# Patient Record
Sex: Female | Born: 1945 | Hispanic: No | Marital: Married | Smoking: Never smoker
Health system: Southern US, Community
[De-identification: ages and names within clinical notes are randomized; demographics above are authoritative.]

## PROBLEM LIST (undated history)

## (undated) DIAGNOSIS — I10 Essential (primary) hypertension: Secondary | ICD-10-CM

---

## 1999-11-15 ENCOUNTER — Other Ambulatory Visit: Admission: RE | Admit: 1999-11-15 | Discharge: 1999-11-15 | Payer: Self-pay | Admitting: Obstetrics and Gynecology

## 2010-09-29 ENCOUNTER — Ambulatory Visit: Payer: Self-pay | Admitting: Unknown Physician Specialty

## 2010-09-30 LAB — PATHOLOGY REPORT

## 2010-10-18 ENCOUNTER — Ambulatory Visit: Payer: Self-pay | Admitting: Surgery

## 2010-10-21 ENCOUNTER — Ambulatory Visit: Payer: Self-pay | Admitting: Surgery

## 2012-01-25 ENCOUNTER — Ambulatory Visit: Payer: Self-pay | Admitting: Unknown Physician Specialty

## 2012-01-30 LAB — PATHOLOGY REPORT

## 2012-02-13 ENCOUNTER — Ambulatory Visit: Payer: Self-pay | Admitting: Unknown Physician Specialty

## 2012-02-13 LAB — CBC WITH DIFFERENTIAL/PLATELET
Basophil #: 0 10*3/uL (ref 0.0–0.1)
Eosinophil #: 0.3 10*3/uL (ref 0.0–0.7)
Eosinophil %: 4.3 %
HGB: 11.8 g/dL — ABNORMAL LOW (ref 12.0–16.0)
Lymphocyte #: 2.3 10*3/uL (ref 1.0–3.6)
Lymphocyte %: 38.6 %
MCHC: 33 g/dL (ref 32.0–36.0)
MCV: 90 fL (ref 80–100)
Monocyte #: 0.3 10*3/uL (ref 0.0–0.7)
Neutrophil %: 51.1 %
Platelet: 286 10*3/uL (ref 150–440)

## 2012-02-13 LAB — BASIC METABOLIC PANEL
Anion Gap: 8 (ref 7–16)
BUN: 12 mg/dL (ref 7–18)
Chloride: 108 mmol/L — ABNORMAL HIGH (ref 98–107)
Co2: 26 mmol/L (ref 21–32)
EGFR (African American): >60
EGFR (Non-African Amer.): >60
Glucose: 126 mg/dL — ABNORMAL HIGH (ref 65–99)
Osmolality: 284 (ref 275–301)
Potassium: 4 mmol/L (ref 3.5–5.1)
Sodium: 142 mmol/L (ref 136–145)

## 2012-02-16 ENCOUNTER — Ambulatory Visit: Payer: Self-pay | Admitting: Surgery

## 2012-08-21 ENCOUNTER — Ambulatory Visit: Payer: Self-pay | Admitting: Internal Medicine

## 2012-12-28 ENCOUNTER — Emergency Department: Payer: Self-pay | Admitting: Emergency Medicine

## 2012-12-28 LAB — CBC
HCT: 42 % (ref 35.0–47.0)
HGB: 13.6 g/dL (ref 12.0–16.0)
MCV: 89 fL (ref 80–100)
Platelet: 303 10*3/uL (ref 150–440)
RDW: 13.3 % (ref 11.5–14.5)
WBC: 7.8 10*3/uL (ref 3.6–11.0)

## 2012-12-28 LAB — COMPREHENSIVE METABOLIC PANEL
Albumin: 4.1 g/dL (ref 3.4–5.0)
Alkaline Phosphatase: 157 U/L — ABNORMAL HIGH (ref 50–136)
BUN: 11 mg/dL (ref 7–18)
Bilirubin,Total: 0.5 mg/dL (ref 0.2–1.0)
Calcium, Total: 8.9 mg/dL (ref 8.5–10.1)
Co2: 19 mmol/L — ABNORMAL LOW (ref 21–32)
Creatinine: 0.6 mg/dL (ref 0.60–1.30)
SGOT(AST): 40 U/L — ABNORMAL HIGH (ref 15–37)
SGPT (ALT): 44 U/L (ref 12–78)
Sodium: 137 mmol/L (ref 136–145)
Total Protein: 8.5 g/dL — ABNORMAL HIGH (ref 6.4–8.2)

## 2012-12-28 LAB — LIPASE, BLOOD: Lipase: 112 U/L (ref 73–393)

## 2012-12-31 LAB — STOOL CULTURE

## 2015-03-07 NOTE — Op Note (Signed)
PATIENT NAME:  Ana Romero, Ana Romero MR#:  [id number] DATE OF BIRTH:  [birth year]  DATE OF PROCEDURE:  02/16/2012  PREOPERATIVE DIAGNOSIS: Chronic anal fissure.   POSTOPERATIVE DIAGNOSIS: Chronic anal fissure.   PROCEDURE: Lateral internal anal sphincterotomy.   SURGEON: Adella HareJ. Wilton Smith, MD  ANESTHESIA: General.   INDICATIONS: This 69 year old female has history of anal pain and anal fissure. She has been using stool softeners, however, with time her fissure has not healed, still having anal pain and surgery was recommended for definitive treatment.   DESCRIPTION OF PROCEDURE: The patient was placed on the operating table in the supine position under general anesthesia. Legs were elevated into the lithotomy position using ankle straps. The anal area was prepared with Betadine solution, draped with sterile towels and sheets.   Initial inspection revealed a minimal amount of external hemorrhoids. There was a posterior anal fissure. Digital exam demonstrated no palpable rectal mass. The anoderm on the patient'Romero left side was infiltrated with 0.5% Sensorcaine with epinephrine, and also infiltrated around the fissure as well. Next the anal canal was dilated large enough to admit three fingers. The bivalve anal retractor was introduced. There was some moderate enlargement of internal hemorrhoids. No blood was seen. No neoplasm seen. Some soft stool was delivered out of the rectum. Next, the incision was made on the patient'Romero left side at the 3:00 position. Incision was some 12 mm in length, carried down into subcutaneous tissues. Several small bleeding points were cauterized. Two hemostats were used to dissect the internal anal sphincter which was recognized by its white color. It did appear to be somewhat hypertrophied and 90% of the sphincter was divided with electrocautery. Hemostasis was intact. The wound was loosely approximated with interrupted 5-0 chromic simple sutures just placing two sutures leaving an  opening distally for drainage. Subsequently the anal retractor was removed. Dressings were applied with paper tape.    The patient tolerated surgery satisfactorily and was then prepared for transfer to the recovery room.  ____________________________ Shela CommonsJ. Renda RollsWilton Smith, MD jws:cms D: 02/16/2012 09:35:18 ET T: 02/16/2012 10:18:35 ET JOB#: [id number]  cc: Adella HareJ. Wilton Smith, MD, <Dictator> Adella HareWILTON J SMITH MD ELECTRONICALLY SIGNED 02/16/2012 12:03

## 2017-01-11 ENCOUNTER — Other Ambulatory Visit: Payer: Self-pay | Admitting: General Surgery

## 2017-01-11 DIAGNOSIS — K6289 Other specified diseases of anus and rectum: Secondary | ICD-10-CM

## 2017-01-23 ENCOUNTER — Other Ambulatory Visit: Payer: Self-pay

## 2017-02-01 ENCOUNTER — Ambulatory Visit
Admission: RE | Admit: 2017-02-01 | Discharge: 2017-02-01 | Disposition: A | Discharge status: Home / Self Care | Payer: PRIVATE HEALTH INSURANCE | Source: Ambulatory Visit | Attending: General Surgery | Admitting: General Surgery

## 2017-02-01 DIAGNOSIS — K6289 Other specified diseases of anus and rectum: Secondary | ICD-10-CM

## 2017-02-01 MED ORDER — GADOBENATE DIMEGLUMINE 529 MG/ML IV SOLN
14.0000 mL | Freq: Once | INTRAVENOUS | Status: AC | PRN
  Administered 2017-02-01: 14 mL via INTRAVENOUS

## 2018-04-05 IMAGING — MR MR PELVIS WO/W CM
4 of 8 series · 24 of 48 positions shown · IV contrast (multihance)
Comparison: 12/29/2012 CT abdomen/ pelvis.

CLINICAL DATA: Chronic anorectal pain with worsening pain and
intermittent rectal bleeding for 1 year. History of lateral internal
anal sphincterotomy for chronic anal fissure on 02/16/2012.

EXAM:
MRI PELVIS WITHOUT AND WITH CONTRAST
TECHNIQUE: Multiplanar multisequence MR imaging of the pelvis was performed
both before and after administration of intravenous contrast.
Creatinine was obtained on site at [HOSPITAL] at [HOSPITAL].
Results: Creatinine 0.7 mg/dL.
CONTRAST:  14mL MULTIHANCE GADOBENATE DIMEGLUMINE 529 MG/ML IV SOLN

[Series 2: T2 · sagittal · 2.5mm · 0.81mm/px · 7 of 36 slices shown (1 of 2)]
[im 1/36]
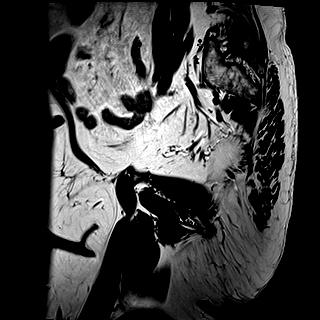
[im 6/36]
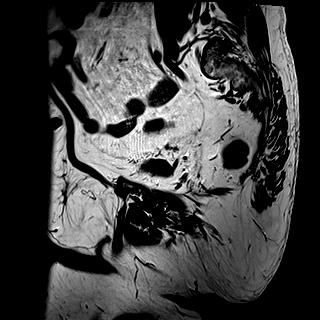
[im 12/36]
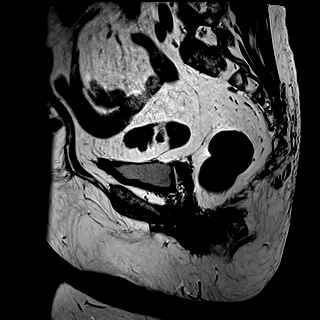
[im 18/36]
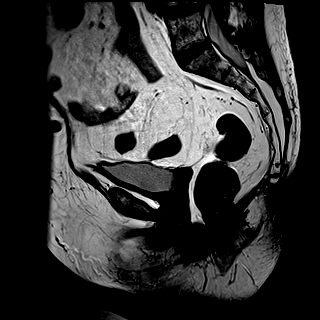
[im 24/36]
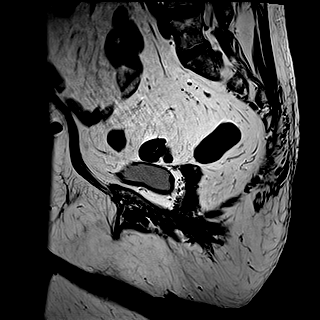
[im 30/36]
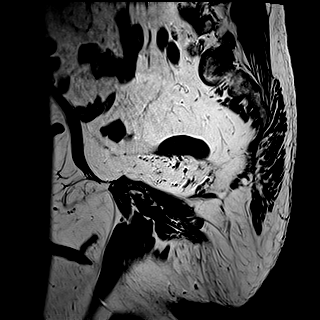
[im 36/36]
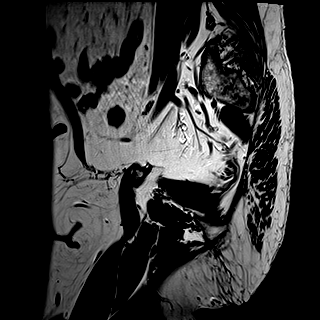

[Series 3: sag 2 fs · sagittal · 2.5mm · 0.41mm/px · 7 of 36 slices shown]
[im 1/36]
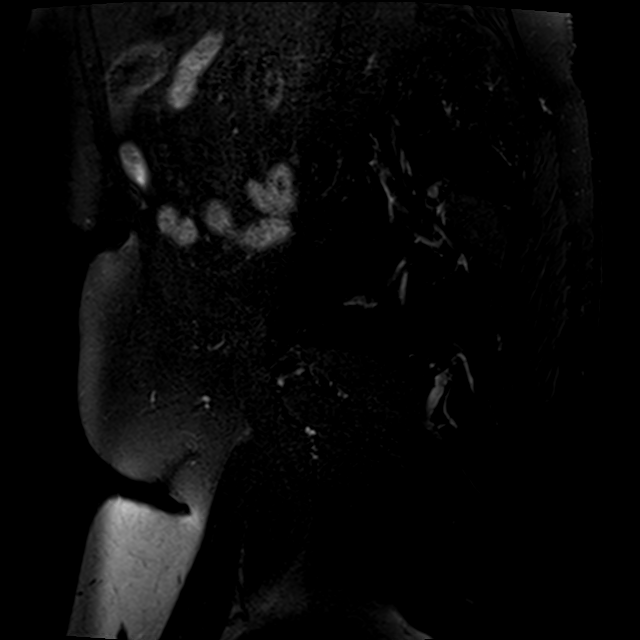
[im 6/36]
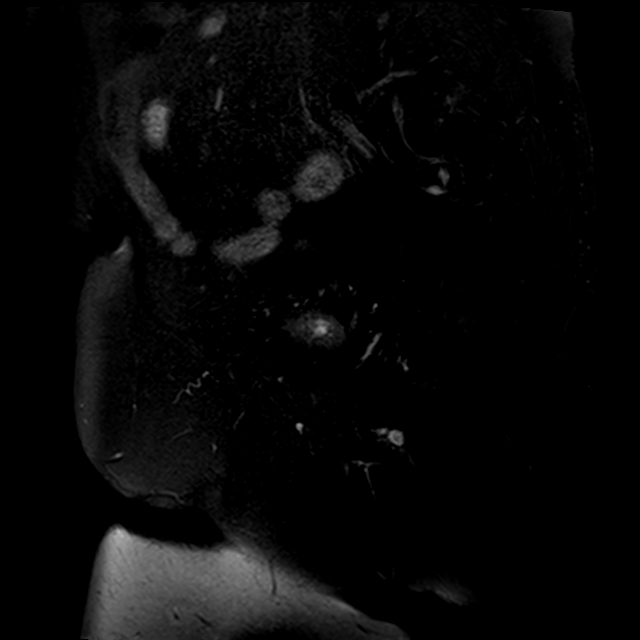
[im 12/36]
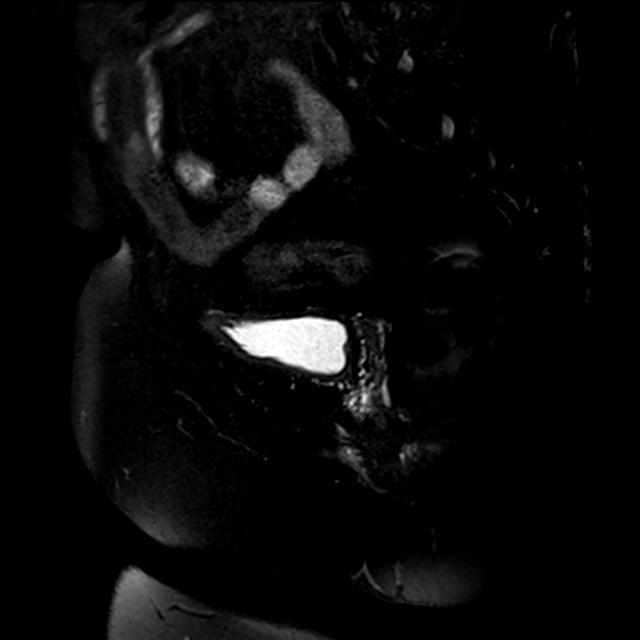
[im 18/36]
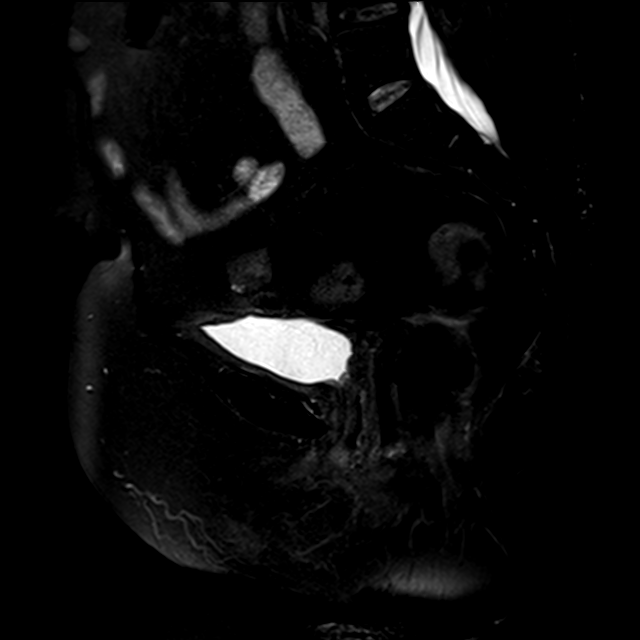
[im 24/36]
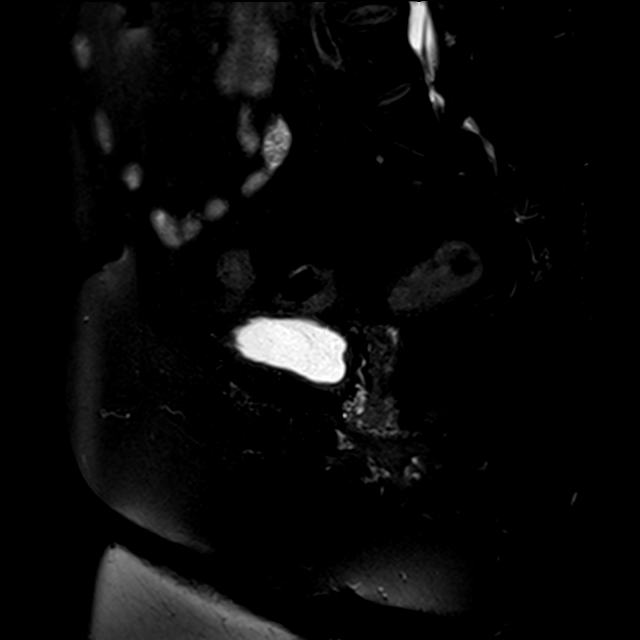
[im 30/36]
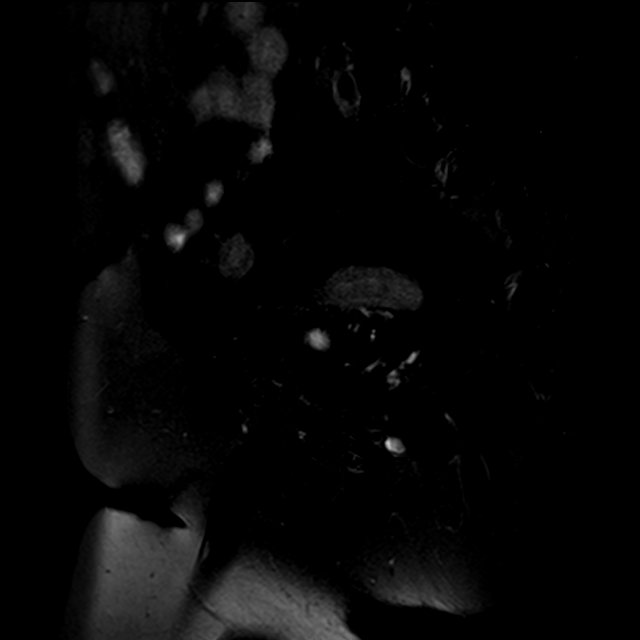
[im 36/36]
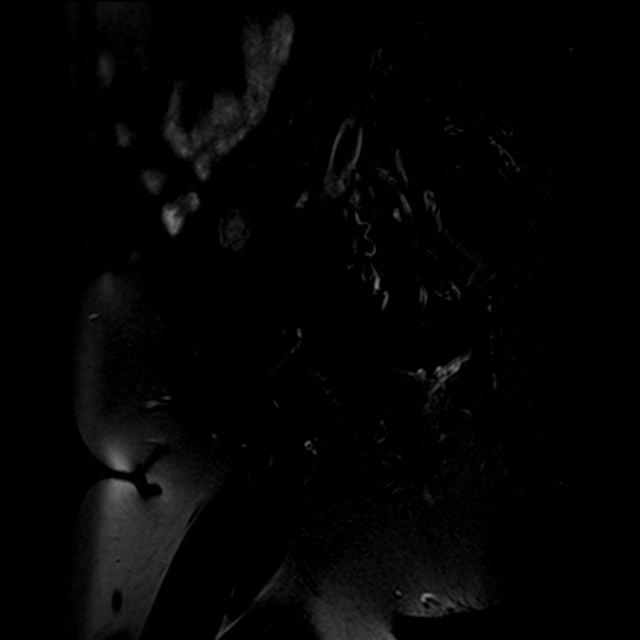

[Series 4: T1 · axial · 4.0mm · 0.57mm/px · z∈[-195,-46]mm · 6 of 35 slices shown]
[im 1/35]
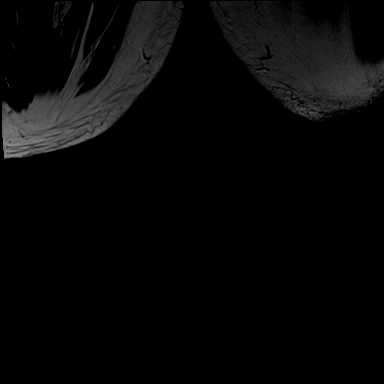
[im 7/35]
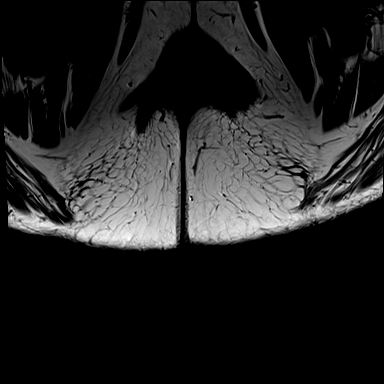
[im 14/35]
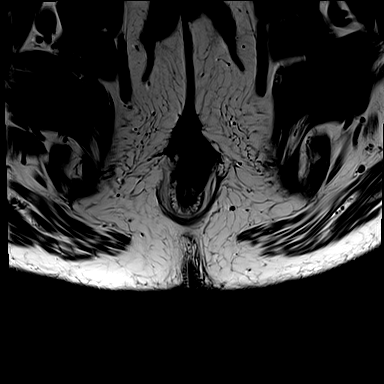
[im 21/35]
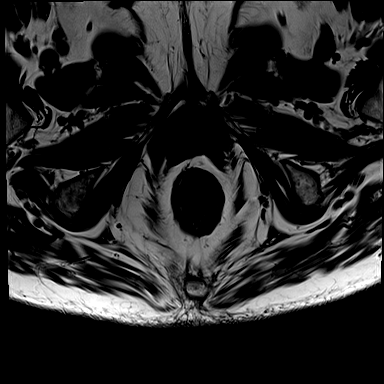
[im 28/35]
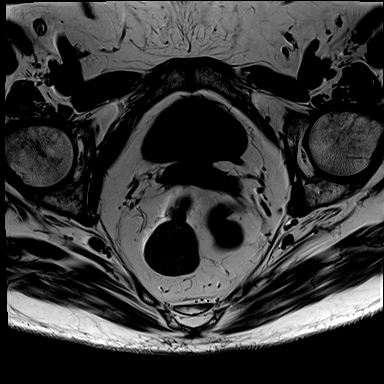
[im 35/35]
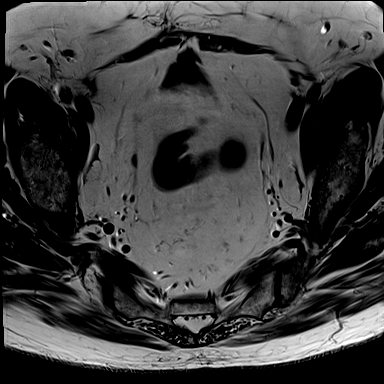

[Series 5: T2 · axial · 4.0mm · 0.57mm/px · z∈[-195,-46]mm · 4 of 35 slices shown (2 of 2)]
[im 1/35]
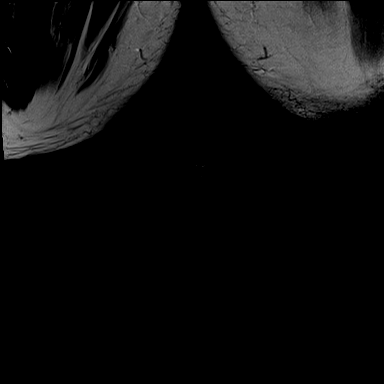
[im 7/35]
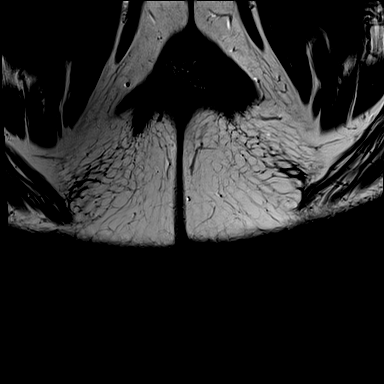
[im 21/35]
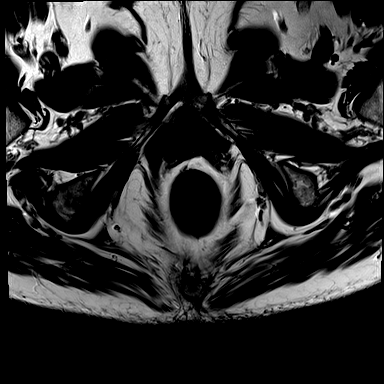
[im 35/35]
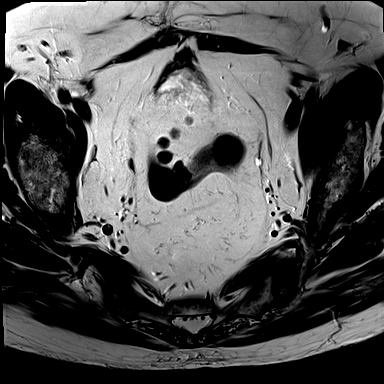

[24 of 48 positions shown; findings below may reference images not displayed]

FINDINGS: Urinary Tract:  Normal bladder.  Normal urethra.

Bowel: Mild sigmoid diverticulosis. There is annular low T2 signal
at the internal anal sphincter, correlating with high-density in
this location on the 12/29/2012 CT study, compatible with
postsurgical change from prior internal anal sphincterotomy. There
is mild circumferential wall thickening and enhancement of the anal
wall (series 9/images 18-19), suggestive of external hemorrhoids. No
evidence of a perianal fistula. No perirectal or perianal fluid
collections.

Vascular/Lymphatic: No pathologically enlarged lymph nodes in the
pelvis.

Reproductive:

Uterus: Status post hysterectomy. No mass or fluid collection at the
vaginal cuff.

Ovaries and Adnexa: The ovaries are not visualized on either side on
this study, which does not contain comprehensive images of the upper
pelvis given anal fistula protocol. No adnexal masses.

Other: No abnormal free fluid in the pelvis. No focal pelvic fluid
collection.

Musculoskeletal: No aggressive appearing focal osseous lesions.
IMPRESSION: 1. Expected postsurgical changes from prior internal anal
sphincterotomy. No evidence of a perianal fistula. No perirectal or
perianal fluid collections.
2. Nonspecific mild circumferential wall thickening and enhancement
of the anal wall, favor external hemorrhoids.
3. Mild sigmoid diverticulosis.
4. Status post hysterectomy, with no abnormal findings at the
vaginal cuff.

## 2021-04-20 ENCOUNTER — Other Ambulatory Visit: Payer: Self-pay

## 2021-04-20 ENCOUNTER — Emergency Department
Admission: EM | Admit: 2021-04-20 | Discharge: 2021-04-20 | Disposition: A | Discharge status: Home / Self Care | Payer: Medicare Other | Attending: Emergency Medicine | Admitting: Emergency Medicine

## 2021-04-20 ENCOUNTER — Encounter: Payer: Self-pay | Admitting: Emergency Medicine

## 2021-04-20 DIAGNOSIS — R21 Rash and other nonspecific skin eruption: Secondary | ICD-10-CM | POA: Diagnosis not present

## 2021-04-20 DIAGNOSIS — R609 Edema, unspecified: Secondary | ICD-10-CM

## 2021-04-20 DIAGNOSIS — Z20822 Contact with and (suspected) exposure to covid-19: Secondary | ICD-10-CM | POA: Insufficient documentation

## 2021-04-20 DIAGNOSIS — I10 Essential (primary) hypertension: Secondary | ICD-10-CM | POA: Insufficient documentation

## 2021-04-20 DIAGNOSIS — R600 Localized edema: Secondary | ICD-10-CM | POA: Insufficient documentation

## 2021-04-20 HISTORY — DX: Essential (primary) hypertension: I10

## 2021-04-20 LAB — COMPREHENSIVE METABOLIC PANEL
ALT: 26 U/L (ref 0–44)
AST: 27 U/L (ref 15–41)
Albumin: 3.5 g/dL (ref 3.5–5.0)
Alkaline Phosphatase: 109 U/L (ref 38–126)
Anion gap: 12 (ref 5–15)
BUN: 10 mg/dL (ref 8–23)
CO2: 22 mmol/L (ref 22–32)
Calcium: 8.8 mg/dL — ABNORMAL LOW (ref 8.9–10.3)
Chloride: 104 mmol/L (ref 98–111)
Creatinine, Ser: 0.62 mg/dL (ref 0.44–1.00)
GFR, Estimated: >60 mL/min (ref >60)
Glucose, Bld: 128 mg/dL — ABNORMAL HIGH (ref 70–99)
Potassium: 3.2 mmol/L — ABNORMAL LOW (ref 3.5–5.1)
Sodium: 138 mmol/L (ref 135–145)
Total Bilirubin: 0.6 mg/dL (ref 0.3–1.2)
Total Protein: 6.5 g/dL (ref 6.5–8.1)

## 2021-04-20 LAB — CBC
HCT: 30.9 % — ABNORMAL LOW (ref 36.0–46.0)
Hemoglobin: 10.5 g/dL — ABNORMAL LOW (ref 12.0–15.0)
MCH: 30.2 pg (ref 26.0–34.0)
MCHC: 34 g/dL (ref 30.0–36.0)
MCV: 88.8 fL (ref 80.0–100.0)
Platelets: 289 10*3/uL (ref 150–400)
RBC: 3.48 MIL/uL — ABNORMAL LOW (ref 3.87–5.11)
RDW: 12.5 % (ref 11.5–15.5)
WBC: 5.7 10*3/uL (ref 4.0–10.5)
nRBC: 0 % (ref 0.0–0.2)

## 2021-04-20 LAB — SARS CORONAVIRUS 2 (TAT 6-24 HRS): SARS Coronavirus 2: NEGATIVE

## 2021-04-20 MED ORDER — PREDNISONE 50 MG PO TABS: 50.0000 mg | ORAL_TABLET | Freq: Every day | ORAL | 0 refills | Status: DC

## 2021-04-20 MED ORDER — PREDNISONE 20 MG PO TABS
60.0000 mg | ORAL_TABLET | Freq: Once | ORAL | Status: AC
  Administered 2021-04-20: 60 mg via ORAL
  Filled 2021-04-20: qty 3

## 2021-04-20 NOTE — ED Triage Notes (Signed)
Pt comes into the ED via POV c/o edema to the hands, legs, and feet.  Pt states on memorial day weekend she noticed a swollen spot on the left side of her neck and since then she has noticed the increase in swelling in her extremities.  Pt denies any known history of CHF.  Pt also c/o rectal pain from a known tear.  PT ambulatory to triage at this time with even and unlabored respirations.

## 2021-04-20 NOTE — ED Provider Notes (Signed)
[facility] Emergency Department Provider Note   ____________________________________________    I have reviewed the triage vital signs and the nursing notes.   HISTORY  Chief Complaint Edema     HPI Ana Romero is a 75 y.o. female who presents with complaints of approximately 1 to 2 weeks of a faint rash on her arms, on her right lower back, she reports it is not pruritic.  She also notes that she had some swelling to her hands bilaterally and feet bilaterally, this has improved somewhat.  Denies shortness of breath.  No fevers chills or cough.  No nausea or vomiting.  No new medications  Past Medical History:  Diagnosis Date  . Hypertension     There are no problems to display for this patient.   Past Surgical History:  Procedure Laterality Date  . ABDOMINAL HYSTERECTOMY    . COLOSTOMY    . COLOSTOMY REVERSAL      Prior to Admission medications   Medication Sig Start Date End Date Taking? Authorizing Provider  predniSONE (DELTASONE) 50 MG tablet Take 1 tablet (50 mg total) by mouth daily with breakfast. 04/21/21  Yes Jene Every, MD     Allergies Patient has no known allergies.  History reviewed. No pertinent family history.  Social History Social History   Tobacco Use  . Smoking status: Never Smoker  . Smokeless tobacco: Never Used  Substance Use Topics  . Alcohol use: Not Currently    Review of Systems  Constitutional: No fever/chills Eyes: No visual changes.  ENT: No sore throat. Cardiovascular: Denies chest pain. Respiratory: Denies shortness of breath. Gastrointestinal: No abdominal pain.  No nausea, no vomiting.   Genitourinary: Negative for dysuria. Musculoskeletal: No calf pain Skin: As above Neurological: Negative for headaches or weakness   ____________________________________________   PHYSICAL EXAM:  VITAL SIGNS: ED Triage Vitals  Enc Vitals Group     BP 04/20/21 1006 (!) 144/74     Pulse  Rate 04/20/21 1006 78     Resp 04/20/21 1006 18     Temp 04/20/21 1006 97.8 F (36.6 C)     Temp Source 04/20/21 1006 Oral     SpO2 04/20/21 1006 100 %     Weight 04/20/21 1007 63.5 kg (140 lb)     Height 04/20/21 1007 1.626 m (5\' 4" )     Head Circumference --      Peak Flow --      Pain Score 04/20/21 1006 5     Pain Loc --      Pain Edu? --      Excl. in GC? --     Constitutional: Alert and oriented. No acute distress. Pleasant and interactive  Nose: No congestion/rhinnorhea. Mouth/Throat: Mucous membranes are moist.    Cardiovascular: Normal rate, regular rhythm Good peripheral circulation. Respiratory: Normal respiratory effort.  No retractions. Lungs CTAB. Gastrointestinal: Soft and nontender. No distention.  No CVA tenderness.  Musculoskeletal: No lower extremity tenderness nor edema.  Warm and well perfused Neurologic:  Normal speech and language. No gross focal neurologic deficits are appreciated.  Skin:  Skin is warm, dry and intact.  Faint erythematous rash, does not appear urticarial or targetoid Psychiatric: Mood and affect are normal. Speech and behavior are normal.  ____________________________________________   LABS (all labs ordered are listed, but only abnormal results are displayed)  Labs Reviewed  COMPREHENSIVE METABOLIC PANEL - Abnormal; Notable for the following components:      Result Value  Potassium 3.2 (*)    Glucose, Bld 128 (*)    Calcium 8.8 (*)    All other components within normal limits  CBC - Abnormal; Notable for the following components:   RBC 3.48 (*)    Hemoglobin 10.5 (*)    HCT 30.9 (*)    All other components within normal limits  SARS CORONAVIRUS 2 (TAT 6-24 HRS)  URINALYSIS, COMPLETE (UACMP) WITH MICROSCOPIC   ____________________________________________  EKG   ____________________________________________  RADIOLOGY   ____________________________________________   PROCEDURES  Procedure(s) performed:  No  Procedures   Critical Care performed: No ____________________________________________   INITIAL IMPRESSION / ASSESSMENT AND PLAN / ED COURSE  Pertinent labs & imaging results that were available during my care of the patient were reviewed by me and considered in my medical decision making (see chart for details).  Patient well-appearing and in no acute distress, reports rash times nearly 2 weeks, nonpainful nonpruritic.  Also with vague swelling in the hands and feet intermittently.  Nonpainful.  No new medications has not take anything for this.  Has not seen her PCP.  Lab work today is reassuring, no indication for admission, will trial steroids, outpatient follow-up with   ____________________________________________   FINAL CLINICAL IMPRESSION(S) / ED DIAGNOSES  Final diagnoses:  Rash  Edema, unspecified type        Note:  This document was prepared using Dragon voice recognition software and may include unintentional dictation errors.   Jene Every, MD 04/20/21 [phone]

## 2021-04-20 NOTE — ED Notes (Addendum)
P to ED c/o swelling in both ankles and both hands. States that about 2 weeks ago she had "swelling on L side of her neck" and then about 2 days later began to have swelling in extremities. Lymph node on L anterior neck is slightly swollen to palpation. Pt states this is where the swelling was, but it was much more swollen 2 weeks ago. [place] noticed the swollen lymph node. Is not aware of being bitten by ticks or other insects. Both ankles appear slightly swollen, R more than left. No pitting edema noted on ankles or legs.  Pt also concerned about rectal mucosa tear. Has appt with OBGYN in August to follow up with this but wanted to have it checked here. Pt states had 4 episodes of diarrhea yesterday after eating out and this made the pain from the tear worse.

## 2023-10-09 ENCOUNTER — Encounter: Payer: Self-pay | Admitting: Urology

## 2023-10-09 ENCOUNTER — Ambulatory Visit: Payer: Medicare Other | Admitting: Urology

## 2023-10-09 ENCOUNTER — Other Ambulatory Visit
Admission: RE | Admit: 2023-10-09 | Discharge: 2023-10-09 | Disposition: A | Discharge status: Home / Self Care | Payer: Medicare Other | Attending: Urology | Admitting: Urology

## 2023-10-09 ENCOUNTER — Other Ambulatory Visit: Payer: Self-pay | Admitting: *Deleted

## 2023-10-09 VITALS — BP 131/79 | HR 84 | Ht 64.0 in | Wt 134.0 lb

## 2023-10-09 DIAGNOSIS — Z87440 Personal history of urinary (tract) infections: Secondary | ICD-10-CM | POA: Diagnosis not present

## 2023-10-09 DIAGNOSIS — N390 Urinary tract infection, site not specified: Secondary | ICD-10-CM

## 2023-10-09 DIAGNOSIS — N3941 Urge incontinence: Secondary | ICD-10-CM

## 2023-10-09 DIAGNOSIS — R300 Dysuria: Secondary | ICD-10-CM

## 2023-10-09 DIAGNOSIS — N958 Other specified menopausal and perimenopausal disorders: Secondary | ICD-10-CM

## 2023-10-09 LAB — URINALYSIS, COMPLETE (UACMP) WITH MICROSCOPIC
Bilirubin Urine: NEGATIVE
Glucose, UA: NEGATIVE mg/dL
Hgb urine dipstick: NEGATIVE
Ketones, ur: NEGATIVE mg/dL
Leukocytes,Ua: NEGATIVE
Nitrite: NEGATIVE
Protein, ur: NEGATIVE mg/dL
RBC / HPF: NONE SEEN RBC/hpf (ref 0–5)
Specific Gravity, Urine: <1.005 — ABNORMAL LOW (ref 1.005–1.030)
pH: 5.5 (ref 5.0–8.0)

## 2023-10-09 MED ORDER — ESTRADIOL 0.1 MG/GM VA CREA: TOPICAL_CREAM | VAGINAL | 12 refills | Status: DC

## 2023-10-09 NOTE — Progress Notes (Signed)
   10/09/23 12:07 PM   Ana Romero 04/26/1946 [id number]  CC: Urinary symptoms, reported recurrent UTI,  HPI: 77 year old female referred for persistent dysuria, urinary symptoms, and reported recurrent infections.  She was previously followed by urogynecologist Dr. 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  She had also been managed for pelvic floor dysfunction in 2022 with Valium suppositories.  She reports she has had multiple UTIs over the last few months with dysuria.  All the urine same was that are available to me have been benign appearing.  She feels like Macrobid helped in the past, most recently was treated with cefdinir without significant improvement.  Her urinalysis today is completely benign.  She reports ongoing dysuria and what sounds like urge incontinence.  History is very challenging to obtain.  She denies any gross hematuria.  Urinalysis today benign   PMH: Past Medical History:  Diagnosis Date   Hypertension     Surgical History: Past Surgical History:  Procedure Laterality Date   ABDOMINAL HYSTERECTOMY     COLOSTOMY     COLOSTOMY REVERSAL     Family History: No family history on file.  Social History:  reports that she has never smoked. She has never been exposed to tobacco smoke. She has never used smokeless tobacco. She reports that she does not currently use alcohol. No history on file for drug use.  Physical Exam: BP 131/79   Pulse 84   Ht 5\' 4"  (1.626 m)   Wt 134 lb (60.8 kg)   BMI 23.00 kg/m    Constitutional:  Alert and oriented, No acute distress. Cardiovascular: No clubbing, cyanosis, or edema. Respiratory: Normal respiratory effort, no increased work of breathing. GI: Abdomen is soft, nontender, nondistended, no abdominal masses  Assessment & Plan:   77 year old female with  history of posterior colporrhaphy, single incision mid urethral sling in October 2022 by outside urogynecologist Dr. Ashley Royalty at [facility] who reports at least a few months of dysuria and urgency and urge incontinence.  Urinalysis today is completely benign.  We discussed possible etiologies including genitourinary syndrome of menopause, pelvic floor dysfunction, OAB, or overflow incontinence.  Extensive review of outside urology records performed.  We discussed the evaluation and treatment of patients with recurrent UTIs at length.  We specifically discussed the differences between asymptomatic bacteriuria and true urinary tract infection.  We discussed the AUA definition of recurrent UTI of at least 2 culture proven symptomatic acute cystitis episodes in a 17-month period, or 3 within a 1 year period.  We discussed the importance of culture directed antibiotic treatment, and antibiotic stewardship.  First-line therapy includes nitrofurantoin(5 days), Bactrim(3 days), or fosfomycin(3 g single dose).  Possible etiologies of recurrent infection include periurethral tissue atrophy in postmenopausal woman, constipation, sexual activity, incomplete emptying, anatomic abnormalities, and even genetic predisposition.  Finally, we discussed the role of perineal hygiene, timed voiding, adequate hydration, topical vaginal estrogen, cranberry prophylaxis, and low-dose antibiotic prophylaxis.  -No evidence of infection today, will follow-up urine culture -Trial of topical estrogen cream for GSM, close follow-up and consider an OAB medication if no improvement -Consider further evaluation with Dr. Sherron Monday if no improvement with above strategies -RTC 6 weeks symptom check, PVR   Legrand Rams, MD 10/09/2023  [facility] [address], [address] [place], [place] [address] [phone]

## 2023-10-10 LAB — URINE CULTURE: Culture: NO GROWTH

## 2023-11-13 NOTE — Progress Notes (Signed)
 11/19/2023 1:40 PM   Ana Romero May 12, 1946 [id number]  Referring provider: No referring provider defined for this encounter.  Urological history: 1. rUTI's -contributory factors of age, GSM, pelvic prolapse and incontinence -documented urine cultures over the last year  November 26th, 2024 No growth  October 25th, 2024 <10.000 colonies  October 1st, 2024 MUF  September 11th, No growth  August 23rd, 2024 E.coli -vaginal estrogen cream three nights weekly  2. Pelvic organ prolapse -posterior colporrhaphy w/ sling (2022)  3. Pelvic floor dysfunction -valium suppositories  4. Urge incontinence -contributing factors of age, GSM, pelvic prolapse and pelvic surgery -vaginal estrogen cream three nights weekly  Chief Complaint  Patient presents with   Follow-up   HPI: Ana Romero is a 77 y.o. female who presents today for 6 weeks follow up.    Previous records reviewed.   She was seen by Dr. Francisca on 10/09/2023 for persistent dysuria, urinary symptoms and reported rUTI's.  She reported multiple UTI's over the last few months, but cultures have not been confirmatory.  She was a challenging historian, but it was decided her symptoms may be caused by GSM and was prescribed vaginal estrogen cream.    She is more daytime voids, 1-2 episodes of nocturia with a strong severe urge to urinate.  She leaks urine when she laughs.  She is leaking 3 more times a day.  She wears 3 absorbent pads daily.  She does limit fluid intake.  She does engage in toilet mapping.  She states at night, she soaks her bed.  She is also having instances where during the day she goes to use the restroom and her pants are already wet.  Patient denies any modifying or aggravating factors.  Patient denies any recent UTI's, gross hematuria, dysuria or suprapubic/flank pain.  Patient denies any fevers, chills, nausea or vomiting.    She states she has been using the vaginal estrogen cream, but has not  been helpful.  She said that she had leakage prior to the colpopexy and it resolved, but over the last several months it has returned.  She wanted her urine checked today for infection, but she was not having any UTI symptoms and I explained to her there is no need to check the urine at this time.  PVR 26 mL   PMH: Past Medical History:  Diagnosis Date   Hypertension     Surgical History: Past Surgical History:  Procedure Laterality Date   ABDOMINAL HYSTERECTOMY     COLOSTOMY     COLOSTOMY REVERSAL      Home Medications:  Allergies as of 11/19/2023       Reactions   Vancomycin Other (See Comments), Swelling   tingling Body tingling.  Almost all oral antibiotics cause diarrhea.   tingling  Body tingling.  Almost all oral antibiotics cause diarrhea.    tingling tingling, Body tingling.  Almost all oral antibiotics cause diarrhea.  , tingling   Other Other (See Comments), Diarrhea   ALL PO antibiotics; can tolerate injections ALL PO antibiotics; can tolerate injections  Blisters on inside of mouth when eating        Medication List        Accurate as of November 19, 2023  1:40 PM. If you have any questions, ask your nurse or doctor.          STOP taking these medications    estradiol  0.1 MG/GM vaginal cream Commonly known as: ESTRACE   TAKE these medications    fluconazole 200 MG tablet Commonly known as: DIFLUCAN Take 200 mg by mouth.   lisinopril 20 MG tablet Commonly known as: ZESTRIL Take 20 mg by mouth daily.        Allergies:  Allergies  Allergen Reactions   Vancomycin Other (See Comments) and Swelling    tingling  Body tingling.  Almost all oral antibiotics cause diarrhea.    tingling  Body tingling.  Almost all oral antibiotics cause diarrhea.    tingling  tingling, Body tingling.  Almost all oral antibiotics cause diarrhea.  , tingling   Other Other (See Comments) and Diarrhea    ALL PO antibiotics; can tolerate  injections  ALL PO antibiotics; can tolerate injections  Blisters on inside of mouth when eating    Family History: No family history on file.  Social History:  reports that she has never smoked. She has never been exposed to tobacco smoke. She has never used smokeless tobacco. She reports that she does not currently use alcohol. No history on file for drug use.  ROS: Pertinent ROS in HPI  Physical Exam: BP (!) 151/79 (BP Location: Left Arm, Patient Position: Sitting, Cuff Size: Normal)   Pulse (!) 104   Ht 5' 4 (1.626 m)   Wt 136 lb 6.4 oz (61.9 kg)   BMI 23.41 kg/m   Constitutional:  Well nourished. Alert and oriented, No acute distress. HEENT: [place] AT, moist mucus membranes.  Trachea midline Cardiovascular: No clubbing, cyanosis, or edema. Respiratory: Normal respiratory effort, no increased work of breathing. GU: No CVA tenderness.  No bladder fullness or masses.  Recession of labia minora, dry, pale vulvar vaginal mucosa and loss of mucosal ridges and folds.  Normal urethral meatus, no lesions, no prolapse, no discharge.   No urethral masses, tenderness and/or tenderness. No bladder fullness, tenderness or masses. Pale vagina mucosa, poor estrogen effect, no discharge, no lesions, fair pelvic support, no cystocele and no rectocele noted.   Anus and perineum are without rashes or lesions.    Neurologic: Grossly intact, no focal deficits, moving all 4 extremities. Psychiatric: Normal mood and affect.    Laboratory Data: N/A  Pertinent Imaging:  11/19/23 13:41  Scan Result 23ml    Assessment & Plan:    1. GSM -Advised her to continue applying the vaginal estrogen cream as it can take several weeks to months to see improvement  2. Urge incontinence -Discussed starting an OAB agent to see if her symptoms would improve or have an appointment with Dr. Gaston to seek his opinion regarding her symptoms -She stated she did not want to try medication before seeing Dr.  MacDiarmid and would like to schedule appointment with him   Return for appointment with Dr. Gaston .  These notes generated with voice recognition software. I apologize for typographical errors.  CLOTILDA HELON RIGGERS  [facility] [address]  [address] [place], [place] [address] [phone]

## 2023-11-19 ENCOUNTER — Other Ambulatory Visit
Admission: RE | Admit: 2023-11-19 | Discharge: 2023-11-19 | Disposition: A | Discharge status: Home / Self Care | Payer: Medicare Other | Attending: Urology | Admitting: Urology

## 2023-11-19 ENCOUNTER — Ambulatory Visit (INDEPENDENT_AMBULATORY_CARE_PROVIDER_SITE_OTHER): Payer: Medicare Other | Admitting: Urology

## 2023-11-19 ENCOUNTER — Encounter: Payer: Self-pay | Admitting: Urology

## 2023-11-19 VITALS — BP 151/79 | HR 104 | Ht 64.0 in | Wt 136.4 lb

## 2023-11-19 DIAGNOSIS — N958 Other specified menopausal and perimenopausal disorders: Secondary | ICD-10-CM | POA: Diagnosis not present

## 2023-11-19 DIAGNOSIS — N390 Urinary tract infection, site not specified: Secondary | ICD-10-CM | POA: Diagnosis present

## 2023-11-19 DIAGNOSIS — N3941 Urge incontinence: Secondary | ICD-10-CM

## 2023-11-19 LAB — BLADDER SCAN AMB NON-IMAGING

## 2023-11-22 LAB — MISC LABCORP TEST (SEND OUT): Labcorp test code: 6884

## 2023-11-30 LAB — MISC LABCORP TEST (SEND OUT): Labcorp test code: 86884

## 2024-01-14 ENCOUNTER — Ambulatory Visit: Payer: Medicare Other | Admitting: Urology

## 2024-01-14 DIAGNOSIS — N3941 Urge incontinence: Secondary | ICD-10-CM | POA: Diagnosis not present

## 2024-01-14 DIAGNOSIS — N390 Urinary tract infection, site not specified: Secondary | ICD-10-CM

## 2024-01-14 LAB — URINALYSIS, COMPLETE
Bilirubin, UA: NEGATIVE
Glucose, UA: NEGATIVE
Ketones, UA: NEGATIVE
Nitrite, UA: NEGATIVE
Protein,UA: NEGATIVE
Specific Gravity, UA: 1.02 (ref 1.005–1.030)
Urobilinogen, Ur: 0.2 mg/dL (ref 0.2–1.0)
pH, UA: 5.5 (ref 5.0–7.5)

## 2024-01-14 LAB — MICROSCOPIC EXAMINATION

## 2024-01-14 NOTE — Progress Notes (Signed)
 01/14/2024 10:54 AM   Ana Romero 1946/10/31 [id number]  Referring provider: No referring provider defined for this encounter.  Chief Complaint  Patient presents with   Follow-up    HPI: Sn:   Had posterior repair and single incision urethral sling October 202 for mixed incontinence and primary stress incontinence.  This was done by Dr. Ashley Royalty at [facility].  She is given Valium suppositories for pelvic floor dysfunction.  Was having recurrent bladder infections.  Was given local estrogen cream.  Saw a nurse practitioner in January 2025 and has had 4 negative cultures and 1 positive culture in August 2024.  She was given estrogen cream 3 times a week  Today Patient think she is getting recurrent bladder infections.  These been worse the last few months.  She is having some burning.  She saw primary care a few days ago is now on cefdinir for 10 days and improving some.  She describes a lot of incontinence which may be currently worse if she does have a bladder infection but it was challenging to sort out the history.  On further questioning she states she was having some rectal pain as well which seems to be improving on the antibiotic  She normally voids every 2-3 hours gets up twice a night.  She describes leaking with coughing sneezing bending lifting.  She has urge incontinence.  She has high-volume bedwetting.  Again some of her symptoms may be worse in the last few months.  When she time voids at home she wears 4 pads that are quite wet but otherwise she uses more pads that could be soaked.  For many days she thinks there may be urine in her stool.  It appears when she first saw Dr. Molli Hazard she had mixed incontinence but a lot of rectal symptoms and a large rectocele.  I could not pull up the operative note but apparently she had a lot of posterior reconstruction and a vault suspension.  She also had a sling.  She had a previous colostomy.  In September 2024 she was seen for  rectal pain and is felt that she had pelvic floor dysfunction.  There was question whether or not to refer her back to general surgery to look for other sources of discomfort.  She was given Anaflex and Valium suppositories.  She was surprised to learn that Dr. Ashley Royalty looks after her incontinence.  It appears the patient may have had side effects and has not taken them.       PMH: Past Medical History:  Diagnosis Date   Hypertension     Surgical History: Past Surgical History:  Procedure Laterality Date   ABDOMINAL HYSTERECTOMY     COLOSTOMY     COLOSTOMY REVERSAL      Home Medications:  Allergies as of 01/14/2024       Reactions   Vancomycin Other (See Comments), Swelling   tingling Body tingling.  Almost all oral antibiotics cause diarrhea.   tingling  Body tingling.  Almost all oral antibiotics cause diarrhea.    tingling tingling, Body tingling.  Almost all oral antibiotics cause diarrhea.  , tingling   Other Other (See Comments), Diarrhea   ALL PO antibiotics; can tolerate injections ALL PO antibiotics; can tolerate injections  Blisters on inside of mouth when eating        Medication List        Accurate as of January 14, 2024 10:54 AM. If you have any questions, ask your  nurse or doctor.          cefdinir 300 MG capsule Commonly known as: OMNICEF Take 300 mg by mouth 2 (two) times daily.   fluconazole 200 MG tablet Commonly known as: DIFLUCAN Take 200 mg by mouth.   lisinopril 20 MG tablet Commonly known as: ZESTRIL Take 20 mg by mouth daily.   predniSONE 20 MG tablet Commonly known as: DELTASONE Take 20 mg by mouth 2 (two) times daily.        Allergies:  Allergies  Allergen Reactions   Vancomycin Other (See Comments) and Swelling    tingling  Body tingling.  Almost all oral antibiotics cause diarrhea.    tingling  Body tingling.  Almost all oral antibiotics cause diarrhea.    tingling  tingling, Body tingling.  Almost all oral  antibiotics cause diarrhea.  , tingling   Other Other (See Comments) and Diarrhea    ALL PO antibiotics; can tolerate injections  ALL PO antibiotics; can tolerate injections  Blisters on inside of mouth when eating    Family History: No family history on file.  Social History:  reports that she has never smoked. She has never been exposed to tobacco smoke. She has never used smokeless tobacco. She reports that she does not currently use alcohol. No history on file for drug use.  ROS:                                        Physical Exam: There were no vitals taken for this visit.  Constitutional:  Alert and oriented, No acute distress. HEENT: [place] AT, moist mucus membranes.  Trachea midline, no masses. Cardiovascular: No clubbing, cyanosis, or edema. Respiratory: Normal respiratory effort, no increased work of breathing. GI: Abdomen is soft, nontender, nondistended, no abdominal masses GU: On pelvic examination she had mild grade 2 hypermobility the bladder neck and no stress incontinence.  Could not see or feel any mesh.  She had shortening of the vagina.  She had good support posteriorly.  I did not do a digital rectal examination. Skin: No rashes, bruises or suspicious lesions. Lymph: No cervical or inguinal adenopathy. Neurologic: Grossly intact, no focal deficits, moving all 4 extremities. Psychiatric: Normal mood and affect.  Laboratory Data: Lab Results  Component Value Date   WBC 5.7 04/20/2021   HGB 10.5 (L) 04/20/2021   HCT 30.9 (L) 04/20/2021   MCV 88.8 04/20/2021   PLT 289 04/20/2021    Lab Results  Component Value Date   CREATININE 0.62 04/20/2021    No results found for: "PSA"  No results found for: "TESTOSTERONE"  No results found for: "HGBA1C"  Urinalysis    Component Value Date/Time   COLORURINE YELLOW 10/09/2023 1014   APPEARANCEUR CLEAR 10/09/2023 1014   LABSPEC <1.005 (L) 10/09/2023 1014   PHURINE 5.5 10/09/2023 1014    GLUCOSEU NEGATIVE 10/09/2023 1014   HGBUR NEGATIVE 10/09/2023 1014   BILIRUBINUR NEGATIVE 10/09/2023 1014   KETONESUR NEGATIVE 10/09/2023 1014   PROTEINUR NEGATIVE 10/09/2023 1014   NITRITE NEGATIVE 10/09/2023 1014   LEUKOCYTESUR NEGATIVE 10/09/2023 1014    Pertinent Imaging: Urine reviewed and sent for culture  Assessment & Plan: Patient may be getting recurrent bladder infections.  Patient has mixed incontinence which could be up regulated if she truly is having bladder infections.  Call if culture positive.  Especially because of her past history and potential urine in  the stool I asked her to follow-up with Dr. Ashley Royalty.  She may need to be put on urinary prophylaxis to see if she down regulates and then her mixed incontinence can be treated accordingly.  It turns out that the primary care doctor may have given her a pill such as oxybutynin and she wants to try it and will see Dr. Ashley Royalty and proceed accordingly.  Call if urine culture positive  1. Urgency incontinence (Primary)  - Urinalysis, Complete  2. Recurrent UTI  - Urinalysis, Complete   No follow-ups on file.  Martina Sinner, MD  [facility] [address], [address] [place], [place] [address] [phone]

## 2024-01-17 LAB — CULTURE, URINE COMPREHENSIVE
